# Patient Record
Sex: Female | Born: 1995 | Race: White | Hispanic: No | Marital: Married | Smoking: Current every day smoker
Health system: Southern US, Community
[De-identification: ages and names within clinical notes are randomized; demographics above are authoritative.]

## PROBLEM LIST (undated history)

## (undated) DIAGNOSIS — Z789 Other specified health status: Secondary | ICD-10-CM

## (undated) HISTORY — PX: NO PAST SURGERIES: SHX2092

---
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
## 2013-06-23 ENCOUNTER — Emergency Department: Payer: Self-pay | Admitting: Emergency Medicine

## 2016-04-08 ENCOUNTER — Other Ambulatory Visit: Payer: Self-pay | Admitting: Family Medicine

## 2016-04-08 DIAGNOSIS — F429 Obsessive-compulsive disorder, unspecified: Secondary | ICD-10-CM | POA: Diagnosis not present

## 2016-04-08 DIAGNOSIS — R1031 Right lower quadrant pain: Secondary | ICD-10-CM | POA: Diagnosis not present

## 2016-04-08 DIAGNOSIS — F419 Anxiety disorder, unspecified: Secondary | ICD-10-CM | POA: Diagnosis not present

## 2016-04-12 ENCOUNTER — Encounter
Admission: EM | Disposition: A | Discharge status: Home / Self Care | Payer: Self-pay | Source: Home / Self Care | Attending: Emergency Medicine

## 2016-04-12 ENCOUNTER — Emergency Department: Payer: BLUE CROSS/BLUE SHIELD | Admitting: Anesthesiology

## 2016-04-12 ENCOUNTER — Encounter: Payer: Self-pay | Admitting: Obstetrics and Gynecology

## 2016-04-12 ENCOUNTER — Emergency Department
Admission: EM | Admit: 2016-04-12 | Discharge: 2016-04-12 | Disposition: A | Discharge status: Home / Self Care | Payer: BLUE CROSS/BLUE SHIELD | Attending: Emergency Medicine | Admitting: Emergency Medicine

## 2016-04-12 ENCOUNTER — Emergency Department: Payer: BLUE CROSS/BLUE SHIELD

## 2016-04-12 DIAGNOSIS — F17200 Nicotine dependence, unspecified, uncomplicated: Secondary | ICD-10-CM | POA: Diagnosis not present

## 2016-04-12 DIAGNOSIS — R102 Pelvic and perineal pain: Secondary | ICD-10-CM | POA: Diagnosis not present

## 2016-04-12 DIAGNOSIS — N801 Endometriosis of ovary: Secondary | ICD-10-CM | POA: Insufficient documentation

## 2016-04-12 DIAGNOSIS — N8301 Follicular cyst of right ovary: Secondary | ICD-10-CM | POA: Diagnosis not present

## 2016-04-12 DIAGNOSIS — K660 Peritoneal adhesions (postprocedural) (postinfection): Secondary | ICD-10-CM | POA: Insufficient documentation

## 2016-04-12 DIAGNOSIS — Z79899 Other long term (current) drug therapy: Secondary | ICD-10-CM | POA: Diagnosis not present

## 2016-04-12 DIAGNOSIS — M791 Myalgia: Secondary | ICD-10-CM | POA: Diagnosis not present

## 2016-04-12 DIAGNOSIS — N83209 Unspecified ovarian cyst, unspecified side: Secondary | ICD-10-CM | POA: Diagnosis present

## 2016-04-12 DIAGNOSIS — N809 Endometriosis, unspecified: Secondary | ICD-10-CM | POA: Diagnosis not present

## 2016-04-12 DIAGNOSIS — N83201 Unspecified ovarian cyst, right side: Secondary | ICD-10-CM | POA: Diagnosis not present

## 2016-04-12 DIAGNOSIS — R109 Unspecified abdominal pain: Secondary | ICD-10-CM | POA: Diagnosis present

## 2016-04-12 DIAGNOSIS — R1084 Generalized abdominal pain: Secondary | ICD-10-CM | POA: Diagnosis not present

## 2016-04-12 HISTORY — PX: LAPAROSCOPIC OVARIAN CYSTECTOMY: SHX6248

## 2016-04-12 HISTORY — DX: Other specified health status: Z78.9

## 2016-04-12 LAB — URINALYSIS COMPLETE WITH MICROSCOPIC (ARMC ONLY)
BACTERIA UA: NONE SEEN
Bilirubin Urine: NEGATIVE
Glucose, UA: NEGATIVE mg/dL
KETONES UR: NEGATIVE mg/dL
LEUKOCYTES UA: NEGATIVE
NITRITE: NEGATIVE
PH: 6 (ref 5.0–8.0)
PROTEIN: NEGATIVE mg/dL
SPECIFIC GRAVITY, URINE: 1.008 (ref 1.005–1.030)

## 2016-04-12 LAB — WET PREP, GENITAL
CLUE CELLS WET PREP: NONE SEEN
SPERM: NONE SEEN
TRICH WET PREP: NONE SEEN
Yeast Wet Prep HPF POC: NONE SEEN

## 2016-04-12 LAB — POCT PREGNANCY, URINE: Preg Test, Ur: NEGATIVE

## 2016-04-12 LAB — COMPREHENSIVE METABOLIC PANEL
ALT: 16 U/L (ref 14–54)
AST: 20 U/L (ref 15–41)
Albumin: 4.7 g/dL (ref 3.5–5.0)
Alkaline Phosphatase: 51 U/L (ref 38–126)
Anion gap: 9 (ref 5–15)
BUN: 17 mg/dL (ref 6–20)
CHLORIDE: 101 mmol/L (ref 101–111)
CO2: 25 mmol/L (ref 22–32)
CREATININE: 0.8 mg/dL (ref 0.44–1.00)
Calcium: 9.3 mg/dL (ref 8.9–10.3)
GFR calc non Af Amer: >60 mL/min (ref >60)
Glucose, Bld: 166 mg/dL — ABNORMAL HIGH (ref 65–99)
Potassium: 3.1 mmol/L — ABNORMAL LOW (ref 3.5–5.1)
SODIUM: 135 mmol/L (ref 135–145)
Total Bilirubin: 0.7 mg/dL (ref 0.3–1.2)
Total Protein: 8.1 g/dL (ref 6.5–8.1)

## 2016-04-12 LAB — CBC
HCT: 41.2 % (ref 35.0–47.0)
Hemoglobin: 13.9 g/dL (ref 12.0–16.0)
MCH: 30.7 pg (ref 26.0–34.0)
MCHC: 33.8 g/dL (ref 32.0–36.0)
MCV: 91.1 fL (ref 80.0–100.0)
PLATELETS: 354 10*3/uL (ref 150–440)
RBC: 4.52 MIL/uL (ref 3.80–5.20)
RDW: 13.9 % (ref 11.5–14.5)
WBC: 16.2 10*3/uL — AB (ref 3.6–11.0)

## 2016-04-12 LAB — CHLAMYDIA/NGC RT PCR (ARMC ONLY)
Chlamydia Tr: NOT DETECTED
N gonorrhoeae: NOT DETECTED

## 2016-04-12 LAB — HCG, QUANTITATIVE, PREGNANCY: hCG, Beta Chain, Quant, S: 1 m[IU]/mL (ref <5)

## 2016-04-12 SURGERY — EXCISION, CYST, OVARY, LAPAROSCOPIC
Anesthesia: General | Site: Abdomen | Laterality: Right | Wound class: Clean Contaminated

## 2016-04-12 MED ORDER — MORPHINE SULFATE (PF) 4 MG/ML IV SOLN
4.0000 mg | Freq: Once | INTRAVENOUS | Status: AC
  Administered 2016-04-12: 4 mg via INTRAVENOUS
  Filled 2016-04-12: qty 1

## 2016-04-12 MED ORDER — FENTANYL CITRATE (PF) 100 MCG/2ML IJ SOLN: 25.0000 ug | INTRAMUSCULAR | Status: DC | PRN

## 2016-04-12 MED ORDER — LIDOCAINE HCL (CARDIAC) 20 MG/ML IV SOLN
INTRAVENOUS | Status: DC | PRN
  Administered 2016-04-12: 60 mg via INTRAVENOUS

## 2016-04-12 MED ORDER — MORPHINE SULFATE (PF) 4 MG/ML IV SOLN
INTRAVENOUS | Status: AC
  Filled 2016-04-12: qty 1

## 2016-04-12 MED ORDER — GLYCOPYRROLATE 0.2 MG/ML IJ SOLN
INTRAMUSCULAR | Status: DC | PRN
  Administered 2016-04-12: .6 mg via INTRAVENOUS

## 2016-04-12 MED ORDER — FENTANYL CITRATE (PF) 100 MCG/2ML IJ SOLN
INTRAMUSCULAR | Status: DC | PRN
  Administered 2016-04-12: 100 ug via INTRAVENOUS
  Administered 2016-04-12 (×2): 50 ug via INTRAVENOUS
  Administered 2016-04-12: 100 ug via INTRAVENOUS

## 2016-04-12 MED ORDER — LACTATED RINGERS IV SOLN
INTRAVENOUS | Status: DC | PRN
  Administered 2016-04-12 (×2): via INTRAVENOUS

## 2016-04-12 MED ORDER — ONDANSETRON HCL 4 MG/2ML IJ SOLN: 4.0000 mg | Freq: Once | INTRAMUSCULAR | Status: DC | PRN

## 2016-04-12 MED ORDER — MIDAZOLAM HCL 2 MG/2ML IJ SOLN
INTRAMUSCULAR | Status: DC | PRN
  Administered 2016-04-12: 2 mg via INTRAVENOUS

## 2016-04-12 MED ORDER — HYDROCODONE-ACETAMINOPHEN 5-325 MG PO TABS: 2.0000 | ORAL_TABLET | Freq: Four times a day (QID) | ORAL | Status: DC | PRN

## 2016-04-12 MED ORDER — BUPIVACAINE HCL 0.5 % IJ SOLN
INTRAMUSCULAR | Status: DC | PRN
  Administered 2016-04-12: 4 mL

## 2016-04-12 MED ORDER — NEOSTIGMINE METHYLSULFATE 10 MG/10ML IV SOLN
INTRAVENOUS | Status: DC | PRN
  Administered 2016-04-12: 4 mg via INTRAVENOUS

## 2016-04-12 MED ORDER — ONDANSETRON HCL 4 MG/2ML IJ SOLN
4.0000 mg | Freq: Once | INTRAMUSCULAR | Status: AC
  Administered 2016-04-12: 4 mg via INTRAVENOUS
  Filled 2016-04-12: qty 2

## 2016-04-12 MED ORDER — SODIUM CHLORIDE 0.9 % IV BOLUS (SEPSIS)
1000.0000 mL | Freq: Once | INTRAVENOUS | Status: AC
  Administered 2016-04-12: 1000 mL via INTRAVENOUS

## 2016-04-12 MED ORDER — SUCCINYLCHOLINE CHLORIDE 20 MG/ML IJ SOLN
INTRAMUSCULAR | Status: DC | PRN
  Administered 2016-04-12: 100 mg via INTRAVENOUS

## 2016-04-12 MED ORDER — ROCURONIUM BROMIDE 100 MG/10ML IV SOLN
INTRAVENOUS | Status: DC | PRN
  Administered 2016-04-12: 10 mg via INTRAVENOUS
  Administered 2016-04-12: 5 mg via INTRAVENOUS
  Administered 2016-04-12: 10 mg via INTRAVENOUS
  Administered 2016-04-12: 25 mg via INTRAVENOUS
  Administered 2016-04-12: 20 mg via INTRAVENOUS

## 2016-04-12 MED ORDER — DEXAMETHASONE SODIUM PHOSPHATE 10 MG/ML IJ SOLN
INTRAMUSCULAR | Status: DC | PRN
  Administered 2016-04-12: 10 mg via INTRAVENOUS

## 2016-04-12 MED ORDER — IBUPROFEN 600 MG PO TABS: 600.0000 mg | ORAL_TABLET | Freq: Four times a day (QID) | ORAL | Status: DC | PRN

## 2016-04-12 MED ORDER — MORPHINE SULFATE (PF) 4 MG/ML IV SOLN
4.0000 mg | Freq: Once | INTRAVENOUS | Status: AC
  Administered 2016-04-12: 4 mg via INTRAVENOUS

## 2016-04-12 MED ORDER — ACETAMINOPHEN 10 MG/ML IV SOLN
INTRAVENOUS | Status: DC | PRN
  Administered 2016-04-12: 1000 mg via INTRAVENOUS

## 2016-04-12 MED ORDER — KETOROLAC TROMETHAMINE 30 MG/ML IJ SOLN
INTRAMUSCULAR | Status: DC | PRN
  Administered 2016-04-12: 30 mg via INTRAVENOUS

## 2016-04-12 MED ORDER — PROPOFOL 10 MG/ML IV BOLUS
INTRAVENOUS | Status: DC | PRN
  Administered 2016-04-12: 170 mg via INTRAVENOUS

## 2016-04-12 MED ORDER — LORAZEPAM 2 MG/ML IJ SOLN
1.0000 mg | Freq: Once | INTRAMUSCULAR | Status: AC
  Administered 2016-04-12: 1 mg via INTRAVENOUS
  Filled 2016-04-12: qty 1

## 2016-04-12 MED ORDER — MORPHINE SULFATE (PF) 4 MG/ML IV SOLN
4.0000 mg | Freq: Once | INTRAVENOUS | Status: AC
Start: 2016-04-12 — End: 2016-04-12
  Administered 2016-04-12: 4 mg via INTRAVENOUS
  Filled 2016-04-12: qty 1

## 2016-04-12 SURGICAL SUPPLY — 47 items
BLADE SURG SZ11 CARB STEEL (BLADE) ×2 IMPLANT
CANISTER SUCT 1200ML W/VALVE (MISCELLANEOUS) ×2 IMPLANT
CATH FOL 2WAY LX 16X5 (CATHETERS) ×2 IMPLANT
CHLORAPREP W/TINT 26ML (MISCELLANEOUS) ×2 IMPLANT
DRAPE LEGGINS SURG 28X43 STRL (DRAPES) ×2 IMPLANT
DRAPE SHEET LG 3/4 BI-LAMINATE (DRAPES) ×4 IMPLANT
DRAPE UNDER BUTTOCK W/FLU (DRAPES) ×2 IMPLANT
DRESSING SURGICEL FIBRLLR 1X2 (HEMOSTASIS) ×1 IMPLANT
DRSG SURGICEL FIBRILLAR 1X2 (HEMOSTASIS) ×2
ELECT REM PT RETURN 9FT ADLT (ELECTROSURGICAL) ×2
ELECTRODE REM PT RTRN 9FT ADLT (ELECTROSURGICAL) ×1 IMPLANT
GLOVE BIO SURGEON STRL SZ7 (GLOVE) ×4 IMPLANT
GLOVE BIOGEL PI IND STRL 7.5 (GLOVE) ×1 IMPLANT
GLOVE BIOGEL PI INDICATOR 7.5 (GLOVE) ×1
GOWN STRL REUS W/ TWL LRG LVL3 (GOWN DISPOSABLE) ×2 IMPLANT
GOWN STRL REUS W/TWL LRG LVL3 (GOWN DISPOSABLE) ×2
GRASPER SUT TROCAR 14GX15 (MISCELLANEOUS) ×2 IMPLANT
IRRIGATION STRYKERFLOW (MISCELLANEOUS) ×1 IMPLANT
IRRIGATOR STRYKERFLOW (MISCELLANEOUS) ×2
IV LACTATED RINGERS 1000ML (IV SOLUTION) ×2 IMPLANT
KIT RM TURNOVER CYSTO AR (KITS) ×2 IMPLANT
LABEL OR SOLS (LABEL) ×2 IMPLANT
LIGASURE BLUNT 5MM 37CM (INSTRUMENTS) ×2 IMPLANT
LIQUID BAND (GAUZE/BANDAGES/DRESSINGS) ×2 IMPLANT
NDL SAFETY 22GX1.5 (NEEDLE) ×2 IMPLANT
NS IRRIG 500ML POUR BTL (IV SOLUTION) ×2 IMPLANT
PACK LAP CHOLECYSTECTOMY (MISCELLANEOUS) ×2 IMPLANT
PAD OB MATERNITY 4.3X12.25 (PERSONAL CARE ITEMS) ×2 IMPLANT
PAD PREP 24X41 OB/GYN DISP (PERSONAL CARE ITEMS) ×2 IMPLANT
POUCH ENDO CATCH 10MM SPEC (MISCELLANEOUS) ×2 IMPLANT
SCISSORS METZENBAUM CVD 33 (INSTRUMENTS) ×2 IMPLANT
SHEARS HARMONIC ACE PLUS 36CM (ENDOMECHANICALS) IMPLANT
SLEEVE ENDOPATH XCEL 5M (ENDOMECHANICALS) ×4 IMPLANT
SOL PREP PVP 2OZ (MISCELLANEOUS) ×2
SOLUTION PREP PVP 2OZ (MISCELLANEOUS) ×1 IMPLANT
SURGILUBE 2OZ TUBE FLIPTOP (MISCELLANEOUS) ×2 IMPLANT
SUT MNCRL 4-0 (SUTURE)
SUT MNCRL 4-0 27XMFL (SUTURE)
SUT VIC AB 0 CT1 36 (SUTURE) ×2 IMPLANT
SUT VIC AB 2-0 UR6 27 (SUTURE) IMPLANT
SUT VIC AB 4-0 SH 27 (SUTURE) ×1
SUT VIC AB 4-0 SH 27XANBCTRL (SUTURE) ×1 IMPLANT
SUTURE MNCRL 4-0 27XMF (SUTURE) IMPLANT
TROCAR ENDO BLADELESS 11MM (ENDOMECHANICALS) ×2 IMPLANT
TROCAR XCEL NON-BLD 5MMX100MML (ENDOMECHANICALS) ×2 IMPLANT
TROCAR XCEL UNIV SLVE 11M 100M (ENDOMECHANICALS) IMPLANT
TUBING INSUFFLATOR HI FLOW (MISCELLANEOUS) ×2 IMPLANT

## 2016-04-12 NOTE — ED Provider Notes (Signed)
Dr. Jean RosenthalJackson plans to take patient to the OR for surgical management of symptoms. Remains hemodynamically stable at this time.  Sharman CheekPhillip Daysy Santini, MD 04/12/16 [phone]

## 2016-04-12 NOTE — ED Notes (Signed)
Pt states that she has been having some abd pain for the past few weeks and her dr is ordering her a pelvic and abd u/s. Pt states that she woke up this am with severe abd pain, states worst pain ever, pt is moaning and crying in pain, states tender all over and does state pain with urination

## 2016-04-12 NOTE — Anesthesia Procedure Notes (Signed)
Procedure Name: Intubation Date/Time: 04/12/2016 5:39 PM Performed by: Irving BurtonBACHICH, Haddy Mullinax Pre-anesthesia Checklist: Patient identified, Emergency Drugs available, Suction available and Patient being monitored Patient Re-evaluated:Patient Re-evaluated prior to inductionOxygen Delivery Method: Circle system utilized Preoxygenation: Pre-oxygenation with 100% oxygen Intubation Type: IV induction Ventilation: Mask ventilation without difficulty Laryngoscope Size: Mac and 3 Grade View: Grade I Tube type: Oral Tube size: 7.0 mm Number of attempts: 1 Airway Equipment and Method: Stylet Placement Confirmation: ETT inserted through vocal cords under direct vision,  positive ETCO2 and breath sounds checked- equal and bilateral Secured at: 20 cm Tube secured with: Tape Dental Injury: Teeth and Oropharynx as per pre-operative assessment

## 2016-04-12 NOTE — Op Note (Signed)
  Operative Note    Pre-Op Diagnosis:  1) right ovarian cyst 2) abdominal pain, suprapubic  Post-Op Diagnosis:  1) right ovarian cyst 2) abdominal pain, suprapubic  Procedures:  1. Operative laparoscopy 2. Right ovarian cystectomy  Primary Surgeon: Thomasene MohairStephen Luisana Lutzke, MD   EBL: 300 mL   IVF: 1,500 mL   Urine output: 500 mL  Specimens: right ovarian cyst wall  Drains: none  Complications: None   Disposition: PACU   Condition: Stable   Findings:  1) Enlarged right ovary with adhesions of omentum, bowel, uterus, pelvic sidewall 2) Left ovary appears normal 3) adhesions to left side wall of ovary and fallopian tube 4) about 1 liter of dark brown fluid drained from cyst prior to removal  Procedure Summary:  The patient was taken to the operating room where general anesthesia was administered and found to be adequate. She was placed in the dorsal supine lithotomy position in Labette[place] stirrups and prepped and draped in usual sterile fashion. After a timeout was called an indwelling catheter was placed in her bladder.   Attention was turned to the abdomen where after injection of local anesthetic, a 5 mm infraumbilical incision was made with the scalpel. Entry into the abdomen was obtained via Optiview trocar technique (a blunt entry technique with camera visualization through the obturator upon entry). Verification of entry into the abdomen was obtained using opening pressures. The abdomen was insufflated with CO2. The camera was introduced through the trocar with verification of atraumatic entry.  A survey was taken of the abdomen and pelvis with the above noted findings.    A 5mm left lower quadrant port was placed under direct, intraabdominal camera visualization without difficulty.  An 4266m suprapubic port was placed about 3cm above the pubic bone without difficulty under direct, intraabdominal camera visualization without difficulty.  A large-bore needle was used to puncture the  cyst. The needle was attached to suction, which evacuated the contents of the cyst.  Approximately 1 liter of dark, brown fluid was evacuated.  The ovarian capsule was opened and the cyst wall was gently peeled off the ovarian stroma.  The cyst wall was transected and removed in several small pieces through the 66mm port.  Once the entire cyst wall was removed, irrigation was used to assess bleeding.  Hemostasis was obtained using Fibrillar directly on the cyst wall bed.  Copious irrigation was undertaken to attempt to clean out of the pelvis.  The 66mm trocar was removed and the port site was closed at the fascia level with 0 vicryl using a Carter-Thomason device.  CO2 was drained from the abdominal cavity and anesthesia gave the patient five deep breaths to remove as much CO2 as possible.  The remaining trocars were removed and reinforced with 4-0 vicryl.  The skin was closed using surgical skin glue.  The foley catheter was removed from the bladder and a vaginal sweep revealed no instruments or sponges.  The patient tolerated the procedure well.  Sponge, lap, needle, and instrument counts were correct x 2.  VTE prophylaxis: SCDs. Antibiotic prophylaxis: none indicated. She was awakened in the operating room and was taken to the PACU in stable condition.   Thomasene MohairStephen Iyari Hagner, MD 04/12/2016 8:24 PM

## 2016-04-12 NOTE — Transfer of Care (Signed)
Immediate Anesthesia Transfer of Care Note  Patient: Jessica Goodman  Procedure(s) Performed: Procedure(s): LAPAROSCOPIC OVARIAN CYSTECTOMY (Right)  Patient Location: PACU  Anesthesia Type:General  Level of Consciousness: sedated  Airway & Oxygen Therapy: Patient Spontanous Breathing and Patient connected to face mask oxygen  Post-op Assessment: Report given to RN and Post -op Vital signs reviewed and stable  Post vital signs: Reviewed and stable  Last Vitals:  Filed Vitals:   04/12/16 2029 04/12/16 2030  BP: 102/46   Pulse: 72 72  Temp: 36.2 C   Resp: 13 11    Last Pain:  Filed Vitals:   04/12/16 2032  PainSc: 5          Complications: No apparent anesthesia complications

## 2016-04-12 NOTE — Anesthesia Postprocedure Evaluation (Signed)
Anesthesia Post Note  Patient: Jessica Goodman  Procedure(s) Performed: Procedure(s) (LRB): LAPAROSCOPIC OVARIAN CYSTECTOMY (Right)  Patient location during evaluation: PACU Anesthesia Type: General Level of consciousness: awake Pain management: pain level controlled Vital Signs Assessment: post-procedure vital signs reviewed and stable Respiratory status: spontaneous breathing Cardiovascular status: stable Anesthetic complications: no    Last Vitals:  Filed Vitals:   04/12/16 2110 04/12/16 2115  BP: 127/74   Pulse: 86 81  Temp:  37.1 C  Resp: 15 17    Last Pain:  Filed Vitals:   04/12/16 2119  PainSc: 5                  VAN STAVEREN,Jaivion Kingsley

## 2016-04-12 NOTE — Consult Note (Signed)
GYNECOLOGY CONSULT NOTE  GYN Consultation  Attending Provider: Minna Antis, MD  LIBORIA PUTNAM [id number] 04/12/2016 3:59 PM    Reason for Consultation:   Jacquiline Doe is a 20 y.o. G0P0000 female seen for evaluation of acute onset abdominal pain and right ovarian cyst.    History of Present Ilness:   She had acute-onset suprapubic abdominal pain at about 7 am today.  This woke her from sleep. She fell on the floor in pain and had nausea and emesis.  She has about a six month history of right lower quadrant pain which has been investigated without an obvious source. She was scheduled to get a pelvic ultrasound in the near future.  Her pain feels like a stabbing.  It radiates around to her back.  It is 10 out of 10 at it's worst. Nothing makes it better or worse.  She associates no other symptoms apart from the above.  The pain has required four doses of morphine 4mg  IV here in the ER.  Menarche at the age of 20 with monthly cycles, every four weeks, lasting 3-4 days. They are moderate in flow.  She is on nothing for contraception.     Past Medical History  Diagnosis Date  . Medical history non-contributory    Past Surgical History  Procedure Laterality Date  . No past surgeries     Allergies:  No Known Allergies   Prior to Admission medications   Medication Sig Start Date End Date Taking? Authorizing Provider  sertraline (ZOLOFT) 50 MG tablet Take 50 mg by mouth daily.   Yes Historical Provider, MD   Obstetric History: She is a G0P0000 female .    Gynecologic History:   Menarche age: 89 Patient's last menstrual period was 04/05/2016. Cycle Hx: flow is moderate Contraception: none   Social History:  She denies smoking, tobacco use, and alcohol use.   Family History:  family history includes Cancer in her maternal grandfather.   Review of Systems:  Negative x 10 systems reviewed except as noted in the HPI.    Objective    BP 119/64 mmHg  Pulse 92  Temp(Src) 97.7 F  (36.5 C) (Oral)  Resp 18  Ht 5\' 5"  (1.651 m)  Wt 140 lb (63.504 kg)  BMI 23.30 kg/m2  SpO2 96%  LMP 04/05/2016 Physical Exam  General:  She is a healthy appearing female in moderate distress.  HEENT:  Normocephalic, atraumatic.   Neck:  supple, no lymphadenopathy Cardiac:  RRR Pulmonary:  Clear to auscultation bilaterally. No wheezes, rales, rhonchi.   Abdomen:  Moderate firm, diffusely tender to palpation, but worse below the umbilicus. +BS, no guarding.  Difficult to tell about rebound tenderness as she is quite tender.  Pelvic:  Deferred.  Performed by ER physician with no significant findings.  Extremities:  Non-tender, symmetric no edema bilaterally.   Neurologic:  Alert & oriented x 3.  Appropriate, conversant.    Laboratory Results:   Lab Results  Component Value Date   WBC 16.2* 04/12/2016   RBC 4.52 04/12/2016   HGB 13.9 04/12/2016   HCT 41.2 04/12/2016   PLT 354 04/12/2016   NA 135 04/12/2016   K 3.1* 04/12/2016   CREATININE 0.80 04/12/2016   Lab Results  Component Value Date   PREGTESTUR NEGATIVE 04/12/2016   Lab Results  Component Value Date   CHLAMYDIA NOT DETECTED 04/12/2016   NGONORRHOEAE NOT DETECTED 04/12/2016   Lab Results  Component Value Date   APPEARANCEUR CLEAR* 04/12/2016  GLUCOSEU NEGATIVE 04/12/2016   BILIRUBINUR NEGATIVE 04/12/2016   KETONESUR NEGATIVE 04/12/2016   LABSPEC 1.008 04/12/2016   HGBUR 1+* 04/12/2016   PHURINE 6.0 04/12/2016   NITRITE NEGATIVE 04/12/2016   LEUKOCYTESUR NEGATIVE 04/12/2016   RBCU 0-5 04/12/2016   WBCU 0-5 04/12/2016   BACTERIA NONE SEEN 04/12/2016   EPIU 0-5* 04/12/2016   MUCOUSUACOMP PRESENT 04/12/2016    Imaging Results: US Transvaginal Non-ob  04/12/2016  CLINICAL DATA:  Myalgias with onset 6 days ago. EXAM: TRANSABDOMINAL AND TRANSVAGINAL ULTRASOUND OF PELVIS DOPPLER ULTRASOUND OF OVARIES TECHNIQUE: Both transabdominal and transvaginal ultrasound examinations of the pelvis were performed.  Transabdominal technique was performed for global imaging of the pelvis including uterus, ovaries, adnexal regions, and pelvic cul-de-sac. It was necessary to proceed with endovaginal exam following the transabdominal exam to visualize the ovaries and endometrium. Color and duplex Doppler ultrasound was utilized to evaluate blood flow to the ovaries. COMPARISON:  None. FINDINGS: Uterus Measurements: 8.1 x 3.3 x 3.7 cm. No fibroids or other mass visualized. Endometrium Thickness: 9.6 mm.  No focal abnormality visualized. Right ovary Measurements: 12.6 x 7.7 x 8.8 cm. Large 12.3 x 9 x 7.4 cm heterogeneously hypoechoic avascular right ovarian mass which may reflect a large endometrioma versus hemorrhagic cyst. Left ovary Measurements: 2.8 x 2.2 x 1.9 cm. Normal appearance/no adnexal mass. Pulsed Doppler evaluation of both ovaries demonstrates normal low-resistance arterial and venous waveforms. Other findings Moderate amount of pelvic free fluid. IMPRESSION: 1. Large 12.3 x 9 x 7.4 cm heterogeneously hypoechoic avascular right ovarian mass which may reflect a large endometrioma versus hemorrhagic cyst. Follow-up pelvic ultrasound in 6-8 weeks is recommended. 2. No ovarian torsion. Electronically Signed   By: Elige Ko   On: 04/12/2016 12:01   US Pelvis Complete  04/12/2016  CLINICAL DATA:  Myalgias with onset 6 days ago. EXAM: TRANSABDOMINAL AND TRANSVAGINAL ULTRASOUND OF PELVIS DOPPLER ULTRASOUND OF OVARIES TECHNIQUE: Both transabdominal and transvaginal ultrasound examinations of the pelvis were performed. Transabdominal technique was performed for global imaging of the pelvis including uterus, ovaries, adnexal regions, and pelvic cul-de-sac. It was necessary to proceed with endovaginal exam following the transabdominal exam to visualize the ovaries and endometrium. Color and duplex Doppler ultrasound was utilized to evaluate blood flow to the ovaries. COMPARISON:  None. FINDINGS: Uterus Measurements: 8.1 x 3.3  x 3.7 cm. No fibroids or other mass visualized. Endometrium Thickness: 9.6 mm.  No focal abnormality visualized. Right ovary Measurements: 12.6 x 7.7 x 8.8 cm. Large 12.3 x 9 x 7.4 cm heterogeneously hypoechoic avascular right ovarian mass which may reflect a large endometrioma versus hemorrhagic cyst. Left ovary Measurements: 2.8 x 2.2 x 1.9 cm. Normal appearance/no adnexal mass. Pulsed Doppler evaluation of both ovaries demonstrates normal low-resistance arterial and venous waveforms. Other findings Moderate amount of pelvic free fluid. IMPRESSION: 1. Large 12.3 x 9 x 7.4 cm heterogeneously hypoechoic avascular right ovarian mass which may reflect a large endometrioma versus hemorrhagic cyst. Follow-up pelvic ultrasound in 6-8 weeks is recommended. 2. No ovarian torsion. Electronically Signed   By: Elige Ko   On: 04/12/2016 12:01   [place] Art/ven Flow Abd Pelv Doppler  04/12/2016  CLINICAL DATA:  Myalgias with onset 6 days ago. EXAM: TRANSABDOMINAL AND TRANSVAGINAL ULTRASOUND OF PELVIS DOPPLER ULTRASOUND OF OVARIES TECHNIQUE: Both transabdominal and transvaginal ultrasound examinations of the pelvis were performed. Transabdominal technique was performed for global imaging of the pelvis including uterus, ovaries, adnexal regions, and pelvic cul-de-sac. It was necessary to proceed with endovaginal exam  following the transabdominal exam to visualize the ovaries and endometrium. Color and duplex Doppler ultrasound was utilized to evaluate blood flow to the ovaries. COMPARISON:  None. FINDINGS: Uterus Measurements: 8.1 x 3.3 x 3.7 cm. No fibroids or other mass visualized. Endometrium Thickness: 9.6 mm.  No focal abnormality visualized. Right ovary Measurements: 12.6 x 7.7 x 8.8 cm. Large 12.3 x 9 x 7.4 cm heterogeneously hypoechoic avascular right ovarian mass which may reflect a large endometrioma versus hemorrhagic cyst. Left ovary Measurements: 2.8 x 2.2 x 1.9 cm. Normal appearance/no adnexal mass. Pulsed Doppler  evaluation of both ovaries demonstrates normal low-resistance arterial and venous waveforms. Other findings Moderate amount of pelvic free fluid. IMPRESSION: 1. Large 12.3 x 9 x 7.4 cm heterogeneously hypoechoic avascular right ovarian mass which may reflect a large endometrioma versus hemorrhagic cyst. Follow-up pelvic ultrasound in 6-8 weeks is recommended. 2. No ovarian torsion. Electronically Signed   By: Elige KoHetal  Patel   On: 04/12/2016 12:01     Assessment & Recommendations   Tamaiya Hilarie FredricksonG Wilkinson is a 20 y.o. G0P0000 female with acute onset abdominal pain with a 12cm right ovarian cyst either a hemorrhagic cyst versus endometrioma.  Discussed treatment options, including; clinical observation (do nothing at this time), treatment with pain medications and outpatient follow up, and surgery for definitive management.  I can not definitively rule out ovarian torsion.  After discussion a mutual decision is made to proceed with an operative laparoscopy, right ovarian cystectomy, possible right oophorectomy.  I have reviewed the risks, benefit, and alternatives of this surgery.  She agrees to proceed.  Will post for right now.   Conard NovakJackson, Dayshawn Irizarry D, MD 04/12/2016 3:59 PM

## 2016-04-12 NOTE — Anesthesia Preprocedure Evaluation (Signed)
Anesthesia Evaluation  Patient identified by MRN, date of birth, ID band Patient awake    Reviewed: Allergy & Precautions, NPO status , Patient's Chart, lab work & pertinent test results  Airway Mallampati: I       Dental  (+) Teeth Intact   Pulmonary Current Smoker,    Pulmonary exam normal        Cardiovascular Exercise Tolerance: Good  Rhythm:Regular Rate:Normal     Neuro/Psych negative neurological ROS     GI/Hepatic negative GI ROS, Neg liver ROS,   Endo/Other  negative endocrine ROS  Renal/GU negative Renal ROS  negative genitourinary   Musculoskeletal negative musculoskeletal ROS (+)   Abdominal Normal abdominal exam  (+)   Peds negative pediatric ROS (+)  Hematology negative hematology ROS (+)   Anesthesia Other Findings   Reproductive/Obstetrics negative OB ROS                             Anesthesia Physical Anesthesia Plan  ASA: II  Anesthesia Plan: General   Post-op Pain Management:    Induction: Intravenous  Airway Management Planned: Oral ETT  Additional Equipment:   Intra-op Plan:   Post-operative Plan: Extubation in OR  Informed Consent: I have reviewed the patients History and Physical, chart, labs and discussed the procedure including the risks, benefits and alternatives for the proposed anesthesia with the patient or authorized representative who has indicated his/her understanding and acceptance.     Plan Discussed with: CRNA  Anesthesia Plan Comments:         Anesthesia Quick Evaluation

## 2016-04-12 NOTE — ED Notes (Signed)
MD at bedside, discussing surgery with pt.

## 2016-04-12 NOTE — ED Provider Notes (Signed)
[facility] Emergency Department Provider Note  Time seen: 8:09 AM  I have reviewed the triage vital signs and the nursing notes.   HISTORY  Chief Complaint No chief complaint on file.    HPI Tanisia Hilarie FredricksonG Goodman is a 20 y.o. female with no past medical history who presents the emergency department with lower abdominal pain. According to the patient she awoke early this morning with severe lower abdominal pain. States nausea, denies vomiting or diarrhea. States her entire abdomen is hurting but the lower abdomen is worse, cannot specify right or left-sided. States she has been having "stomach issues" in which she has been having intermittent pain on the right side of her abdomen, and has an ultrasound scheduled. Patient states mild dysuria, denies vaginal bleeding but does state mild discharge. Denies fever. Describes her pain as 10/10 sharp lower abdominal pain.     No past medical history on file.  There are no active problems to display for this patient.   No past surgical history on file.  No current outpatient prescriptions on file.  Allergies Review of patient's allergies indicates not on file.  No family history on file.  Social History Social History  Substance Use Topics  . Smoking status: Not on file  . Smokeless tobacco: Not on file  . Alcohol Use: Not on file    Review of Systems Constitutional: Negative for fever. Cardiovascular: Negative for chest pain. Respiratory: Negative for shortness of breath. Gastrointestinal: Lower abdominal pain. Genitourinary: Negative for dysuria. Neurological: Negative for headache 10-point ROS otherwise negative.  ____________________________________________   PHYSICAL EXAM:  Constitutional: Alert and oriented. Mild distress due to pain and nausea Eyes: Normal exam ENT   Head: Normocephalic and atraumatic.   Mouth/Throat: Mucous membranes are moist. Cardiovascular: Normal rate, regular  rhythm. No murmur Respiratory: Normal respiratory effort without tachypnea nor retractions. Breath sounds are clear  Gastrointestinal: Soft, mild diffuse abdominal tenderness palpation, moderate lower abdominal tenderness with moderate right mid abdominal tenderness. No rebound or guarding. No distention. No CVA tenderness. Musculoskeletal: Nontender with normal range of motion in all extremities.  Neurologic:  Normal speech and language. No gross focal neurologic deficits Skin:  Skin is warm, dry and intact.  Psychiatric: Mood and affect are normal.   ____________________________________________    RADIOLOGY  Ultrasound consistent with a right ovarian large hemorrhagic cyst versus endometrioma.   ____________________________________________    INITIAL IMPRESSION / ASSESSMENT AND PLAN / ED COURSE  Pertinent labs & imaging results that were available during my care of the patient were reviewed by me and considered in my medical decision making (see chart for details).  Patient presents the emergency department with significant lower abdominal pain which began this morning. We will check labs, treat the patient's pain and nausea, IV hydrate and closely monitor in the emergency department. Patient will likely require abdominal imaging, we will await lab work before deciding on imaging modality.  Labs show an elevated white blood cell count of 16,000, otherwise are normal.  Pelvic examination shows a mild amount of white cervical discharge. No cervicitis noted. Patient did have significant tenderness to palpation of bilateral adnexa as well as cervical motion tenderness. We'll obtain a pelvic ultrasound to further evaluate.  Ultrasound shows large endometrioma versus hemorrhagic cyst, we'll discuss with Dr. Jean RosenthalJackson of OB/GYN.  ----------------------------------------- 3:13 PM on 04/12/2016 -----------------------------------------  Dr. Jean RosenthalJackson will be down to see the patient. Patient  care signed out to oncoming physician, OB/GYN consultation pending.   FINAL CLINICAL  IMPRESSION(S) / ED DIAGNOSES  Abdominal pain Endometrioma/hemorrhagic cyst   Minna Antis, MD 04/12/16 [phone]

## 2016-04-12 NOTE — Discharge Instructions (Signed)

## 2016-04-12 NOTE — ED Notes (Signed)
Pt taken to us.

## 2016-04-12 NOTE — H&P (Signed)
GYNECOLOGY HISTORY AND PHYSICAL NOTE    Attending Provider: Thomasene MohairStephen Jackson, MD  Jessica Goodman [phone] 04/12/2016 3:59 PM   Chief Complaint:  Jessica DoeMia G Goodman is a 20 y.o. G0P0000 female seen for evaluation of acute onset abdominal pain and right ovarian cyst.   History of Present Ilness:  She had acute-onset suprapubic abdominal pain at about 7 am today. This woke her from sleep. She fell on the floor in pain and had nausea and emesis. She has about a six month history of right lower quadrant pain which has been investigated without an obvious source. She was scheduled to get a pelvic ultrasound in the near future. Her pain feels like a stabbing. It radiates around to her back. It is 10 out of 10 at it's worst. Nothing makes it better or worse. She associates no other symptoms apart from the above. The pain has required four doses of morphine 4mg  IV here in the ER. Menarche at the age of 89 with monthly cycles, every four weeks, lasting 3-4 days. They are moderate in flow. She is on nothing for contraception.    Past Medical History  Diagnosis Date  . Medical history non-contributory    Past Surgical History  Procedure Laterality Date  . No past surgeries     Allergies:  No Known Allergies   Prior to Admission medications   Medication Sig Start Date End Date Taking? Authorizing Provider  sertraline (ZOLOFT) 50 MG tablet Take 50 mg by mouth daily.   Yes Historical Provider, MD   Obstetric History: She is a G0P0000 female .   Gynecologic History:  Menarche age: [age] Patient's last menstrual period was 04/05/2016. Cycle Hx: flow is moderate Contraception: none   Social History: She denies smoking, tobacco use, and alcohol use.   Family History: family history includes Cancer in her maternal grandfather.   Review of Systems: Negative x 10 systems reviewed except as noted in the HPI.   Objective    BP 119/64 mmHg   Pulse 92  Temp(Src) 97.7 F (36.5 C) (Oral)  Resp 18  Ht 5\' 5"  (1.651 m)  Wt 140 lb (63.504 kg)  BMI 23.30 kg/m2  SpO2 96%  LMP 04/05/2016 Physical Exam  General: She is a healthy appearing female in moderate distress.  HEENT: Normocephalic, atraumatic.  Neck: supple, no lymphadenopathy Cardiac: RRR Pulmonary: Clear to auscultation bilaterally. No wheezes, rales, rhonchi.  Abdomen: Moderate firm, diffusely tender to palpation, but worse below the umbilicus. +BS, no guarding. Difficult to tell about rebound tenderness as she is quite tender.  Pelvic: Deferred. Performed by ER physician with no significant findings.  Extremities: Non-tender, symmetric no edema bilaterally.  Neurologic: Alert & oriented x 3. Appropriate, conversant.   Laboratory Results:   Recent Labs    Lab Results  Component Value Date   WBC 16.2* 04/12/2016   RBC 4.52 04/12/2016   HGB 13.9 04/12/2016   HCT 41.2 04/12/2016   PLT 354 04/12/2016   NA 135 04/12/2016   K 3.1* 04/12/2016   CREATININE 0.80 04/12/2016      Recent Labs    Lab Results  Component Value Date   PREGTESTUR NEGATIVE 04/12/2016      Recent Labs    Lab Results  Component Value Date   CHLAMYDIA NOT DETECTED 04/12/2016   NGONORRHOEAE NOT DETECTED 04/12/2016      Recent Labs    Lab Results  Component Value Date   APPEARANCEUR CLEAR* 04/12/2016   GLUCOSEU NEGATIVE 04/12/2016  BILIRUBINUR NEGATIVE 04/12/2016   KETONESUR NEGATIVE 04/12/2016   LABSPEC 1.008 04/12/2016   HGBUR 1+* 04/12/2016   PHURINE 6.0 04/12/2016   NITRITE NEGATIVE 04/12/2016   LEUKOCYTESUR NEGATIVE 04/12/2016   RBCU 0-5 04/12/2016   WBCU 0-5 04/12/2016   BACTERIA NONE SEEN 04/12/2016   EPIU 0-5* 04/12/2016   MUCOUSUACOMP PRESENT 04/12/2016      Imaging Results: US Transvaginal Non-ob  04/12/2016 CLINICAL DATA:  Myalgias with onset 6 days ago. EXAM: TRANSABDOMINAL AND TRANSVAGINAL ULTRASOUND OF PELVIS DOPPLER ULTRASOUND OF OVARIES TECHNIQUE: Both transabdominal and transvaginal ultrasound examinations of the pelvis were performed. Transabdominal technique was performed for global imaging of the pelvis including uterus, ovaries, adnexal regions, and pelvic cul-de-sac. It was necessary to proceed with endovaginal exam following the transabdominal exam to visualize the ovaries and endometrium. Color and duplex Doppler ultrasound was utilized to evaluate blood flow to the ovaries. COMPARISON: None. FINDINGS: Uterus Measurements: 8.1 x 3.3 x 3.7 cm. No fibroids or other mass visualized. Endometrium Thickness: 9.6 mm. No focal abnormality visualized. Right ovary Measurements: 12.6 x 7.7 x 8.8 cm. Large 12.3 x 9 x 7.4 cm heterogeneously hypoechoic avascular right ovarian mass which may reflect a large endometrioma versus hemorrhagic cyst. Left ovary Measurements: 2.8 x 2.2 x 1.9 cm. Normal appearance/no adnexal mass. Pulsed Doppler evaluation of both ovaries demonstrates normal low-resistance arterial and venous waveforms. Other findings Moderate amount of pelvic free fluid. IMPRESSION: 1. Large 12.3 x 9 x 7.4 cm heterogeneously hypoechoic avascular right ovarian mass which may reflect a large endometrioma versus hemorrhagic cyst. Follow-up pelvic ultrasound in 6-8 weeks is recommended. 2. No ovarian torsion. Electronically Signed By: Elige Ko On: 04/12/2016 12:01   US Pelvis Complete  04/12/2016 CLINICAL DATA: Myalgias with onset 6 days ago. EXAM: TRANSABDOMINAL AND TRANSVAGINAL ULTRASOUND OF PELVIS DOPPLER ULTRASOUND OF OVARIES TECHNIQUE: Both transabdominal and transvaginal ultrasound examinations of the pelvis were performed. Transabdominal technique was performed for global imaging of the pelvis including uterus, ovaries, adnexal regions, and pelvic cul-de-sac. It was necessary to proceed with endovaginal exam  following the transabdominal exam to visualize the ovaries and endometrium. Color and duplex Doppler ultrasound was utilized to evaluate blood flow to the ovaries. COMPARISON: None. FINDINGS: Uterus Measurements: 8.1 x 3.3 x 3.7 cm. No fibroids or other mass visualized. Endometrium Thickness: 9.6 mm. No focal abnormality visualized. Right ovary Measurements: 12.6 x 7.7 x 8.8 cm. Large 12.3 x 9 x 7.4 cm heterogeneously hypoechoic avascular right ovarian mass which may reflect a large endometrioma versus hemorrhagic cyst. Left ovary Measurements: 2.8 x 2.2 x 1.9 cm. Normal appearance/no adnexal mass. Pulsed Doppler evaluation of both ovaries demonstrates normal low-resistance arterial and venous waveforms. Other findings Moderate amount of pelvic free fluid. IMPRESSION: 1. Large 12.3 x 9 x 7.4 cm heterogeneously hypoechoic avascular right ovarian mass which may reflect a large endometrioma versus hemorrhagic cyst. Follow-up pelvic ultrasound in 6-8 weeks is recommended. 2. No ovarian torsion. Electronically Signed By: Elige Ko On: 04/12/2016 12:01   [place] Art/ven Flow Abd Pelv Doppler  04/12/2016 CLINICAL DATA: Myalgias with onset 6 days ago. EXAM: TRANSABDOMINAL AND TRANSVAGINAL ULTRASOUND OF PELVIS DOPPLER ULTRASOUND OF OVARIES TECHNIQUE: Both transabdominal and transvaginal ultrasound examinations of the pelvis were performed. Transabdominal technique was performed for global imaging of the pelvis including uterus, ovaries, adnexal regions, and pelvic cul-de-sac. It was necessary to proceed with endovaginal exam following the transabdominal exam to visualize the ovaries and endometrium. Color and duplex Doppler ultrasound was utilized to evaluate blood flow to the  ovaries. COMPARISON: None. FINDINGS: Uterus Measurements: 8.1 x 3.3 x 3.7 cm. No fibroids or other mass visualized. Endometrium Thickness: 9.6 mm. No focal abnormality visualized. Right ovary Measurements: 12.6 x 7.7 x 8.8 cm. Large 12.3 x 9  x 7.4 cm heterogeneously hypoechoic avascular right ovarian mass which may reflect a large endometrioma versus hemorrhagic cyst. Left ovary Measurements: 2.8 x 2.2 x 1.9 cm. Normal appearance/no adnexal mass. Pulsed Doppler evaluation of both ovaries demonstrates normal low-resistance arterial and venous waveforms. Other findings Moderate amount of pelvic free fluid. IMPRESSION: 1. Large 12.3 x 9 x 7.4 cm heterogeneously hypoechoic avascular right ovarian mass which may reflect a large endometrioma versus hemorrhagic cyst. Follow-up pelvic ultrasound in 6-8 weeks is recommended. 2. No ovarian torsion. Electronically Signed By: Elige Ko On: 04/12/2016 12:01     Assessment & Plan   Tatisha WINRY EGNEW is a 20 y.o. G0P0000 female with acute onset abdominal pain with a 12cm right ovarian cyst either a hemorrhagic cyst versus endometrioma. Discussed treatment options, including; clinical observation (do nothing at this time), treatment with pain medications and outpatient follow up, and surgery for definitive management. I can not definitively rule out ovarian torsion. After discussion a mutual decision is made to proceed with an operative laparoscopy, right ovarian cystectomy, possible right oophorectomy.  I have reviewed the risks, benefit, and alternatives of this surgery. She agrees to proceed. Will post for right now.   Conard Novak, MD 04/12/2016 3:59 PM

## 2016-04-15 ENCOUNTER — Encounter: Payer: Self-pay | Admitting: Obstetrics and Gynecology

## 2016-04-16 LAB — SURGICAL PATHOLOGY

## 2016-04-17 ENCOUNTER — Other Ambulatory Visit: Payer: Self-pay

## 2016-05-16 ENCOUNTER — Other Ambulatory Visit: Payer: Self-pay | Admitting: Family Medicine

## 2016-05-16 ENCOUNTER — Other Ambulatory Visit (HOSPITAL_COMMUNITY)
Admission: RE | Admit: 2016-05-16 | Discharge: 2016-05-16 | Disposition: A | Discharge status: Home / Self Care | Payer: BLUE CROSS/BLUE SHIELD | Source: Ambulatory Visit | Attending: Family Medicine | Admitting: Family Medicine

## 2016-05-16 DIAGNOSIS — F419 Anxiety disorder, unspecified: Secondary | ICD-10-CM | POA: Diagnosis not present

## 2016-05-16 DIAGNOSIS — Z113 Encounter for screening for infections with a predominantly sexual mode of transmission: Secondary | ICD-10-CM | POA: Diagnosis not present

## 2016-05-16 DIAGNOSIS — Z309 Encounter for contraceptive management, unspecified: Secondary | ICD-10-CM | POA: Diagnosis not present

## 2016-05-16 DIAGNOSIS — Z01419 Encounter for gynecological examination (general) (routine) without abnormal findings: Secondary | ICD-10-CM | POA: Diagnosis not present

## 2016-05-16 DIAGNOSIS — Z124 Encounter for screening for malignant neoplasm of cervix: Secondary | ICD-10-CM | POA: Diagnosis not present

## 2016-05-21 LAB — CYTOLOGY - PAP

## 2016-07-29 DIAGNOSIS — B354 Tinea corporis: Secondary | ICD-10-CM | POA: Diagnosis not present

## 2016-11-19 DIAGNOSIS — F419 Anxiety disorder, unspecified: Secondary | ICD-10-CM | POA: Diagnosis not present

## 2016-12-18 DIAGNOSIS — F419 Anxiety disorder, unspecified: Secondary | ICD-10-CM | POA: Diagnosis not present

## 2017-01-14 DIAGNOSIS — L02419 Cutaneous abscess of limb, unspecified: Secondary | ICD-10-CM | POA: Diagnosis not present

## 2017-02-11 DIAGNOSIS — N390 Urinary tract infection, site not specified: Secondary | ICD-10-CM | POA: Diagnosis not present

## 2017-03-11 DIAGNOSIS — R21 Rash and other nonspecific skin eruption: Secondary | ICD-10-CM | POA: Diagnosis not present

## 2017-06-25 DIAGNOSIS — Z309 Encounter for contraceptive management, unspecified: Secondary | ICD-10-CM | POA: Diagnosis not present

## 2017-06-25 DIAGNOSIS — F419 Anxiety disorder, unspecified: Secondary | ICD-10-CM | POA: Diagnosis not present

## 2017-09-02 DIAGNOSIS — N926 Irregular menstruation, unspecified: Secondary | ICD-10-CM | POA: Diagnosis not present

## 2017-09-02 DIAGNOSIS — Z309 Encounter for contraceptive management, unspecified: Secondary | ICD-10-CM | POA: Diagnosis not present

## 2017-09-28 ENCOUNTER — Emergency Department
Admission: EM | Admit: 2017-09-28 | Discharge: 2017-09-28 | Disposition: A | Discharge status: Home / Self Care | Payer: BLUE CROSS/BLUE SHIELD | Attending: Student in an Organized Health Care Education/Training Program | Admitting: Student in an Organized Health Care Education/Training Program

## 2017-09-28 ENCOUNTER — Encounter: Payer: Self-pay | Admitting: Emergency Medicine

## 2017-09-28 ENCOUNTER — Emergency Department: Payer: BLUE CROSS/BLUE SHIELD

## 2017-09-28 DIAGNOSIS — S46902A Unspecified injury of unspecified muscle, fascia and tendon at shoulder and upper arm level, left arm, initial encounter: Secondary | ICD-10-CM | POA: Diagnosis not present

## 2017-09-28 DIAGNOSIS — S4992XA Unspecified injury of left shoulder and upper arm, initial encounter: Secondary | ICD-10-CM | POA: Diagnosis not present

## 2017-09-28 DIAGNOSIS — S0083XA Contusion of other part of head, initial encounter: Secondary | ICD-10-CM | POA: Insufficient documentation

## 2017-09-28 DIAGNOSIS — Y9389 Activity, other specified: Secondary | ICD-10-CM | POA: Diagnosis not present

## 2017-09-28 DIAGNOSIS — M25512 Pain in left shoulder: Secondary | ICD-10-CM | POA: Diagnosis not present

## 2017-09-28 DIAGNOSIS — F17210 Nicotine dependence, cigarettes, uncomplicated: Secondary | ICD-10-CM | POA: Diagnosis not present

## 2017-09-28 DIAGNOSIS — Y999 Unspecified external cause status: Secondary | ICD-10-CM | POA: Insufficient documentation

## 2017-09-28 DIAGNOSIS — S0990XA Unspecified injury of head, initial encounter: Secondary | ICD-10-CM | POA: Diagnosis present

## 2017-09-28 DIAGNOSIS — Y92410 Unspecified street and highway as the place of occurrence of the external cause: Secondary | ICD-10-CM | POA: Diagnosis not present

## 2017-09-28 DIAGNOSIS — Z79899 Other long term (current) drug therapy: Secondary | ICD-10-CM | POA: Insufficient documentation

## 2017-09-28 DIAGNOSIS — S46912A Strain of unspecified muscle, fascia and tendon at shoulder and upper arm level, left arm, initial encounter: Secondary | ICD-10-CM | POA: Insufficient documentation

## 2017-09-28 MED ORDER — TRAMADOL HCL 50 MG PO TABS: 50.0000 mg | ORAL_TABLET | Freq: Four times a day (QID) | ORAL | 0 refills | Status: AC | PRN
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
## 2017-10-15 IMAGING — US US TRANSVAGINAL NON-OB
1 series · 13 of 25 positions shown · non-contrast
Comparison: None.

CLINICAL DATA: Myalgias with onset 6 days ago.



[Series 1: us transvaginal non-ob · 0.24mm/px · 13 of 103 slices shown]
[im 1/103]
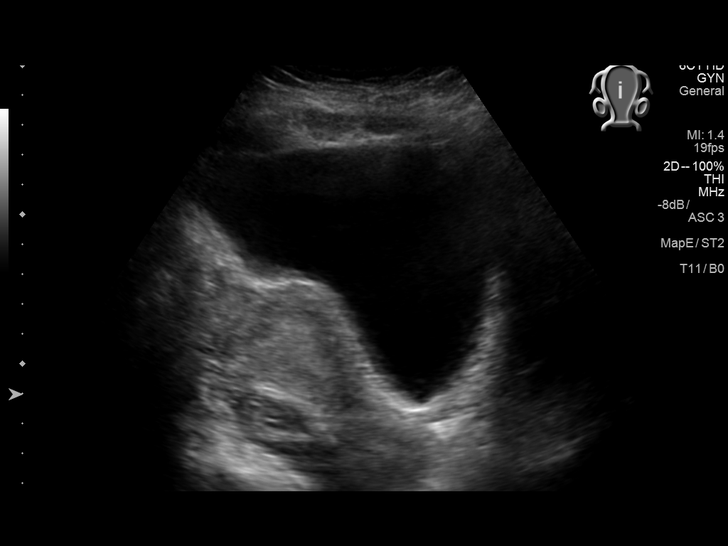
[im 9/103]
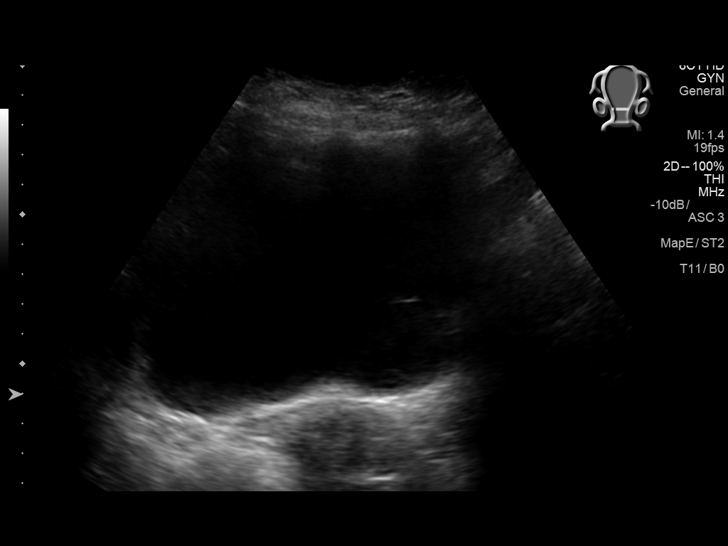
[im 18/103]
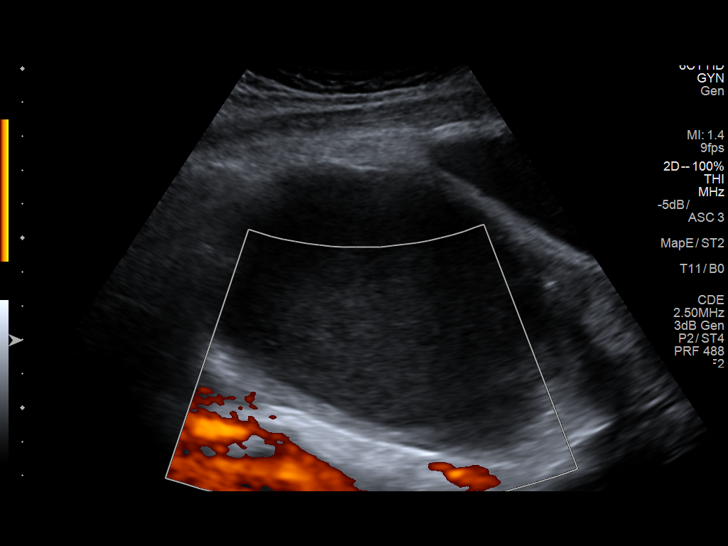
[im 26/103]
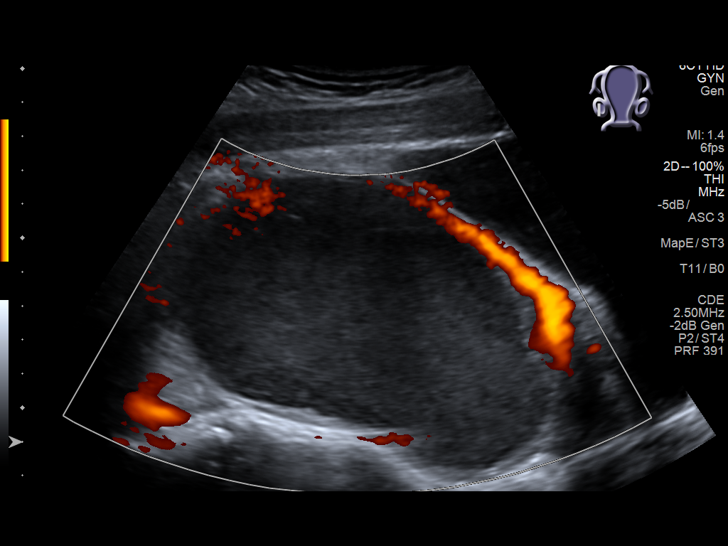
[im 35/103]
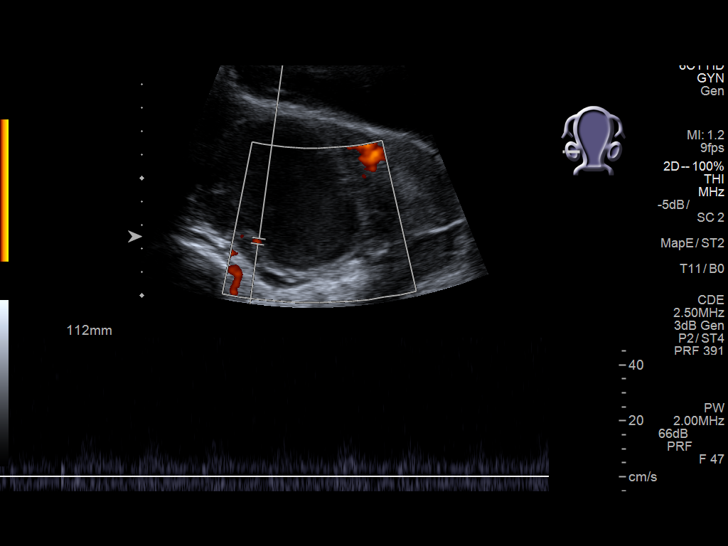
[im 43/103]
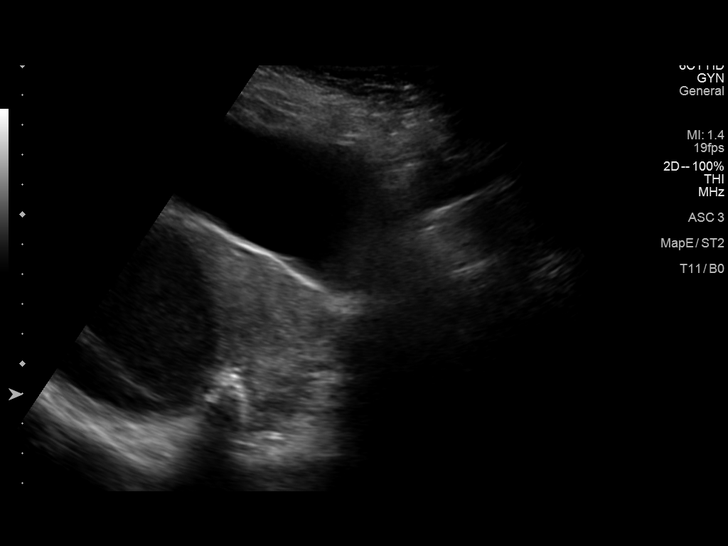
[im 52/103]
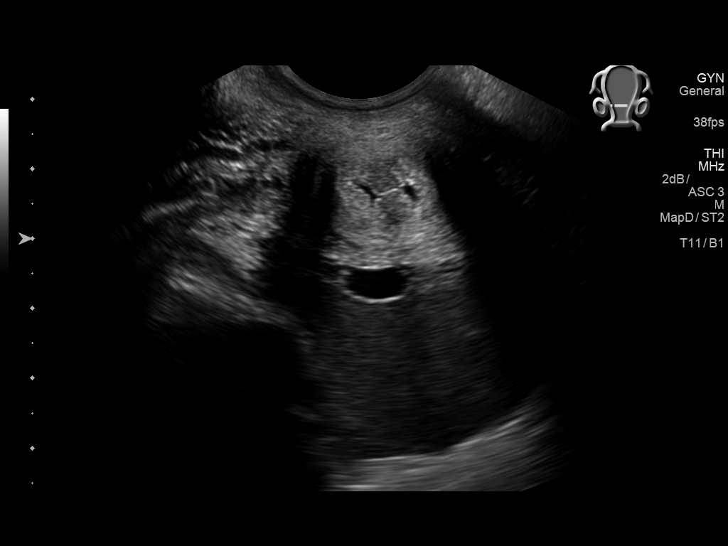
[im 60/103]
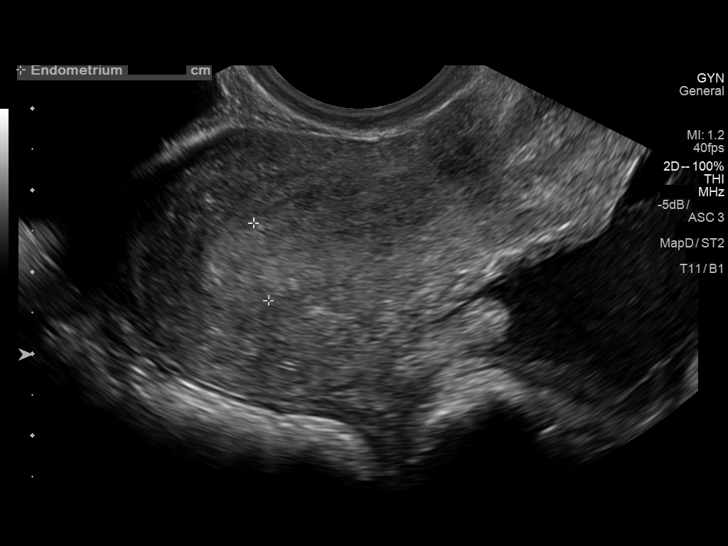
[im 69/103]
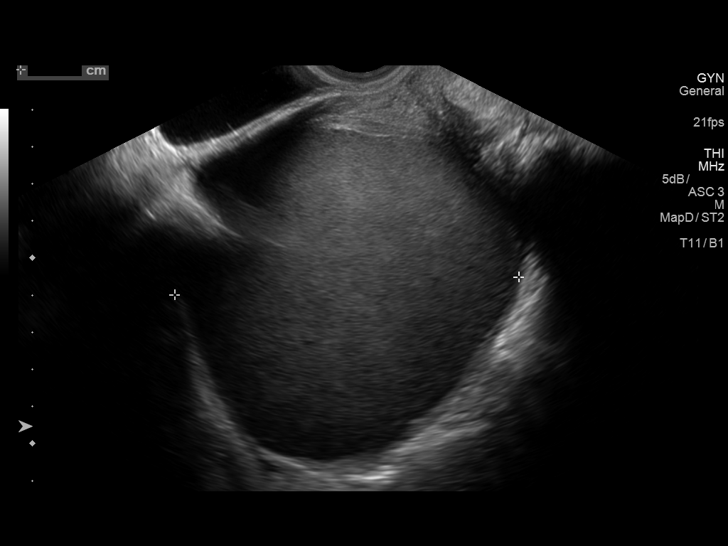
[im 77/103]
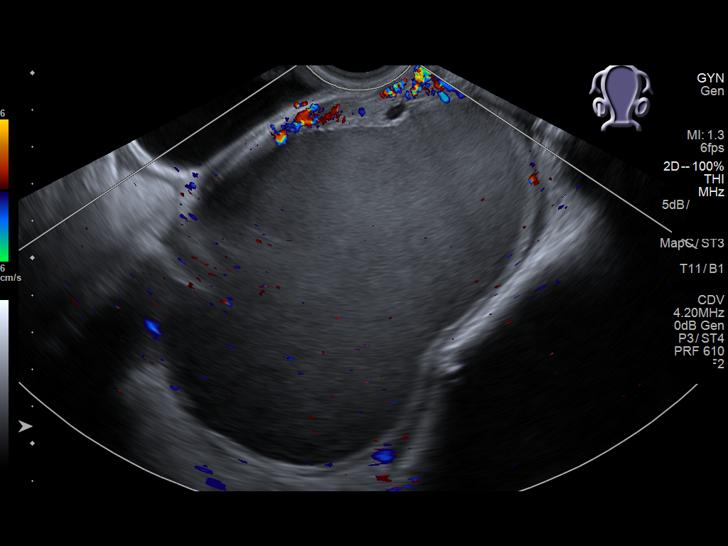
[im 86/103]
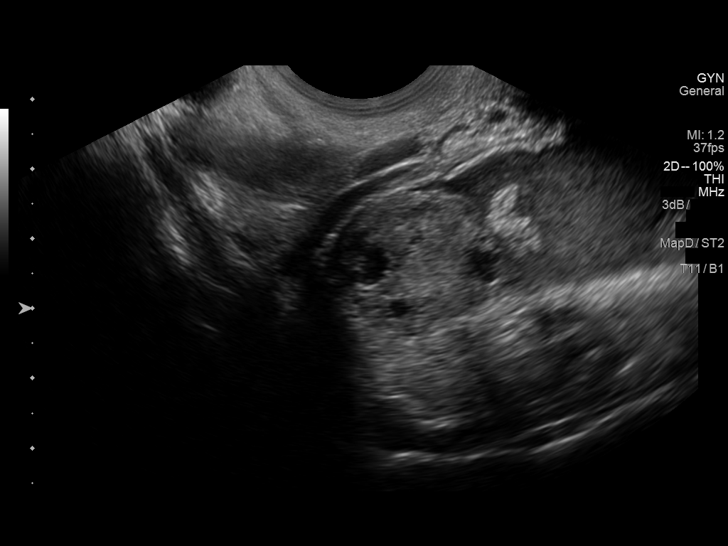
[im 94/103]
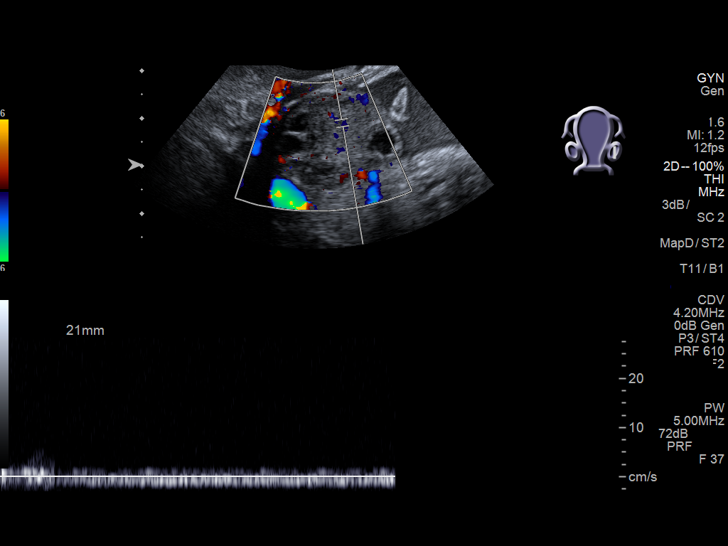
[im 103/103]
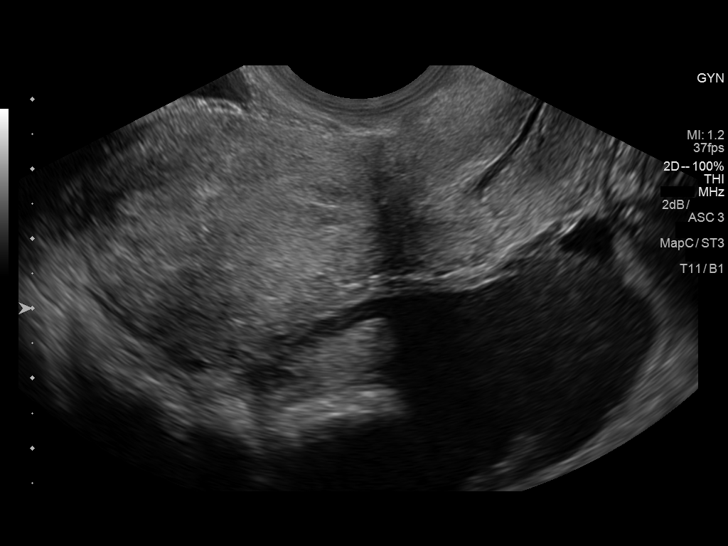

[13 of 25 positions shown; findings below may reference images not displayed]

FINDINGS: Uterus

Measurements: 8.1 x 3.3 x 3.7 cm. No fibroids or other mass
visualized.

Endometrium

Thickness: 9.6 mm.  No focal abnormality visualized.

Right ovary

Measurements: 12.6 x 7.7 x 8.8 cm. Large 12.3 x 9 x 7.4 cm
heterogeneously hypoechoic avascular right ovarian mass which may
reflect a large endometrioma versus hemorrhagic cyst.

Left ovary

Measurements: 2.8 x 2.2 x 1.9 cm. Normal appearance/no adnexal mass.

Pulsed Doppler evaluation of both ovaries demonstrates normal
low-resistance arterial and venous waveforms.

Other findings

Moderate amount of pelvic free fluid.
IMPRESSION: 1. Large 12.3 x 9 x 7.4 cm heterogeneously hypoechoic avascular
right ovarian mass which may reflect a large endometrioma versus
hemorrhagic cyst. Follow-up pelvic ultrasound in 6-8 weeks is
recommended.
2. No ovarian torsion.

## 2018-06-03 DIAGNOSIS — F329 Major depressive disorder, single episode, unspecified: Secondary | ICD-10-CM | POA: Diagnosis not present

## 2018-07-06 DIAGNOSIS — F329 Major depressive disorder, single episode, unspecified: Secondary | ICD-10-CM | POA: Diagnosis not present

## 2018-07-06 DIAGNOSIS — F419 Anxiety disorder, unspecified: Secondary | ICD-10-CM | POA: Diagnosis not present
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
## 2019-04-09 DIAGNOSIS — Z20828 Contact with and (suspected) exposure to other viral communicable diseases: Secondary | ICD-10-CM | POA: Diagnosis not present

## 2019-09-24 DIAGNOSIS — Z20828 Contact with and (suspected) exposure to other viral communicable diseases: Secondary | ICD-10-CM | POA: Diagnosis not present

## 2019-09-24 DIAGNOSIS — N760 Acute vaginitis: Secondary | ICD-10-CM | POA: Diagnosis not present

## 2019-10-20 DIAGNOSIS — N760 Acute vaginitis: Secondary | ICD-10-CM | POA: Diagnosis not present

## 2019-12-27 ENCOUNTER — Other Ambulatory Visit (HOSPITAL_COMMUNITY)
Admission: RE | Admit: 2019-12-27 | Discharge: 2019-12-27 | Disposition: A | Discharge status: Home / Self Care | Payer: BC Managed Care – PPO | Source: Ambulatory Visit | Attending: Family Medicine | Admitting: Family Medicine

## 2019-12-27 DIAGNOSIS — F419 Anxiety disorder, unspecified: Secondary | ICD-10-CM | POA: Diagnosis not present

## 2019-12-27 DIAGNOSIS — F329 Major depressive disorder, single episode, unspecified: Secondary | ICD-10-CM | POA: Diagnosis not present

## 2019-12-27 DIAGNOSIS — Z124 Encounter for screening for malignant neoplasm of cervix: Secondary | ICD-10-CM | POA: Insufficient documentation

## 2019-12-27 DIAGNOSIS — Z309 Encounter for contraceptive management, unspecified: Secondary | ICD-10-CM | POA: Diagnosis not present

## 2019-12-29 LAB — CYTOLOGY - PAP: Diagnosis: NEGATIVE

## 2020-09-29 ENCOUNTER — Ambulatory Visit
Admission: EM | Admit: 2020-09-29 | Discharge: 2020-09-29 | Disposition: A | Discharge status: Home / Self Care | Payer: 59 | Attending: Emergency Medicine | Admitting: Emergency Medicine

## 2020-09-29 ENCOUNTER — Other Ambulatory Visit: Payer: Self-pay

## 2020-09-29 ENCOUNTER — Encounter: Payer: Self-pay | Admitting: Emergency Medicine

## 2020-09-29 DIAGNOSIS — J039 Acute tonsillitis, unspecified: Secondary | ICD-10-CM | POA: Diagnosis not present

## 2020-09-29 LAB — GROUP A STREP BY PCR: Group A Strep by PCR: NOT DETECTED

## 2020-09-29 MED ORDER — AMOXICILLIN-POT CLAVULANATE 875-125 MG PO TABS: 1.0000 | ORAL_TABLET | Freq: Two times a day (BID) | ORAL | 0 refills | Status: AC

## 2020-09-29 NOTE — Discharge Instructions (Addendum)
Take the Augmentin twice daily for 7 days with food.  Use Tylenol and ibuprofen as needed for pain and fever.  Gargle with warm salt water 2-3 times a day.  If you develop any new or worsening symptoms return for reevaluation or see your primary care provider.

## 2020-09-29 NOTE — ED Triage Notes (Signed)
Patient c/o body aches, fever, headache, sore throat that started on Monday.

## 2020-09-29 NOTE — ED Provider Notes (Signed)
[facility]    CSN: [id number] Arrival date & time: 09/29/20  1135      History   Chief Complaint Chief Complaint  Patient presents with  . Generalized Body Aches  . Sore Throat  . Headache    HPI Jessica Goodman is a 24 y.o. female.   HPI   64-year-old female here for evaluation of headache, sore throat, body aches, and fever that she is had for the past 4 days.  Patient also reports that she has had bilateral ear pain that she vomited 1 time the day her symptoms started. Patient denies runny nose, cough, shortness of breath, diarrhea, changes to her sense of taste and smell, or sick contacts. Patient has not had her Covid or flu vaccines.  Past Medical History:  Diagnosis Date  . Medical history non-contributory     Patient Active Problem List   Diagnosis Date Noted  . Abdominal pain 04/12/2016  . Ovarian cyst 04/12/2016    Past Surgical History:  Procedure Laterality Date  . LAPAROSCOPIC OVARIAN CYSTECTOMY Right 04/12/2016   Procedure: LAPAROSCOPIC OVARIAN CYSTECTOMY;  Surgeon: Conard Novak, MD;  Location: [facility] ORS;  Service: Gynecology;  Laterality: Right;  . NO PAST SURGERIES      OB History    Gravida  0   Para  0   Term  0   Preterm  0   AB  0   Living  0     SAB  0   IAB  0   Ectopic  0   Multiple  0   Live Births               Home Medications    Prior to Admission medications   Medication Sig Start Date End Date Taking? Authorizing Provider  amoxicillin-clavulanate (AUGMENTIN) 875-125 MG tablet Take 1 tablet by mouth every 12 (twelve) hours. 09/29/20  Yes Becky Augusta, NP  DULoxetine (CYMBALTA) 60 MG capsule Take 1 tablet by mouth daily.    [provider]  norethindrone-ethinyl estradiol-iron (JUNEL FE 1.5/30) 1.5-30 MG-MCG tablet Take 1 tablet by mouth daily.    [provider]  sertraline (ZOLOFT) 50 MG tablet Take 50 mg by mouth daily.  09/29/20  [provider]    Family  History Family History  Problem Relation Age of Onset  . Cancer Maternal Grandfather     Social History Social History   Tobacco Use  . Smoking status: Current Every Day Smoker    Packs/day: 0.50    Types: Cigarettes  . Smokeless tobacco: Never Used  Substance Use Topics  . Alcohol use: Yes  . Drug use: No     Allergies   Patient has no known allergies.   Review of Systems Review of Systems  Constitutional: Positive for fever. Negative for activity change and appetite change.  HENT: Positive for sore throat. Negative for ear pain, rhinorrhea and sinus pain.   Respiratory: Negative for cough, shortness of breath and wheezing.   Cardiovascular: Negative for chest pain.  Gastrointestinal: Positive for vomiting. Negative for diarrhea.  Musculoskeletal: Positive for arthralgias and myalgias.  Skin: Negative for rash.  Neurological: Positive for headaches.  Hematological: Negative.   Psychiatric/Behavioral: Negative.      Physical Exam Triage Vital Signs ED Triage Vitals  Enc Vitals Group     BP 09/29/20 1415 123/76     Pulse Rate 09/29/20 1415 98     Resp 09/29/20 1415 18     Temp 09/29/20  1415 97.9 F (36.6 C)     Temp Source 09/29/20 1415 Oral     SpO2 09/29/20 1415 100 %     Weight 09/29/20 1313 160 lb (72.6 kg)     Height 09/29/20 1313 5\' 5"  (1.651 m)     Head Circumference --      Peak Flow --      Pain Score 09/29/20 1313 10     Pain Loc --      Pain Edu? --      Excl. in GC? --    No data found.  Updated Vital Signs BP 123/76 (BP Location: Right Arm)   Pulse 98   Temp 97.9 F (36.6 C) (Oral)   Resp 18   Ht 5\' 5"  (1.651 m)   Wt 160 lb (72.6 kg)   LMP 09/01/2020   SpO2 100%   BMI 26.63 kg/m   Visual Acuity Right Eye Distance:   Left Eye Distance:   Bilateral Distance:    Right Eye Near:   Left Eye Near:    Bilateral Near:     Physical Exam Vitals and nursing note reviewed.  Constitutional:      General: She is not in acute  distress.    Appearance: She is well-developed and normal weight. She is not toxic-appearing.  HENT:     Head: Normocephalic and atraumatic.     Right Ear: Tympanic membrane and ear canal normal. No middle ear effusion. Tympanic membrane is not erythematous.     Left Ear: Tympanic membrane and ear canal normal.  No middle ear effusion. Tympanic membrane is not erythematous.     Nose: Congestion and rhinorrhea present.     Comments: Nasal mucosa is mildly erythematous and edematous with scant clear nasal discharge.    Mouth/Throat:     Mouth: Mucous membranes are moist.     Pharynx: Oropharynx is clear. Posterior oropharyngeal erythema present.     Tonsils: Tonsillar exudate present. 2+ on the right. 2+ on the left.  Cardiovascular:     Rate and Rhythm: Normal rate and regular rhythm.     Heart sounds: Normal heart sounds. No murmur heard. No gallop.   Pulmonary:     Effort: Pulmonary effort is normal.     Breath sounds: Normal breath sounds. No wheezing or rhonchi.  Musculoskeletal:     Cervical back: Normal range of motion and neck supple.  Lymphadenopathy:     Cervical: Cervical adenopathy present.  Skin:    General: Skin is warm and dry.     Capillary Refill: Capillary refill takes less than 2 seconds.     Findings: No erythema or rash.  Neurological:     General: No focal deficit present.     Mental Status: She is alert.  Psychiatric:        Mood and Affect: Mood normal.        Behavior: Behavior normal.      UC Treatments / Results  Labs (all labs ordered are listed, but only abnormal results are displayed) Labs Reviewed  GROUP A STREP BY PCR    EKG   Radiology No results found.  Procedures Procedures (including critical care time)  Medications Ordered in UC Medications - No data to display  Initial Impression / Assessment and Plan / UC Course  I have reviewed the triage vital signs and the nursing notes.  Pertinent labs & imaging results that were  available during my care of the patient were reviewed by me  and considered in my medical decision making (see chart for details).   Patient is here for evaluation of sore throat, headache, body aches, and fever that have been going on for past 4 days. Patient denies any sick contacts but has not been vaccinated against Covid or flu. Physical exam reveals mildly erythematous and edematous nasal mucosa with clear nasal discharge. Posterior oropharynx has mild erythema. Tonsillar pillars are erythematous, injected, covered in white exudate. Patient does have tender, shotty, anterior cervical lymphadenopathy. Lungs are clear to auscultation in all fields. Strep PCR ais pending.  Strep PCR is negative.  Discharge home with a diagnosis of exudative tonsillitis, will give Augmentin twice daily for 7 days.  Final Clinical Impressions(s) / UC Diagnoses   Final diagnoses:  Exudative tonsillitis     Discharge Instructions     Take the Augmentin twice daily for 7 days with food.  Use Tylenol and ibuprofen as needed for pain and fever.  Gargle with warm salt water 2-3 times a day.  If you develop any new or worsening symptoms return for reevaluation or see your primary care provider.    ED Prescriptions    Medication Sig Dispense Auth. Provider   amoxicillin-clavulanate (AUGMENTIN) 875-125 MG tablet Take 1 tablet by mouth every 12 (twelve) hours. 14 tablet Becky Augusta, NP     PDMP not reviewed this encounter.   Becky Augusta, NP 09/29/20 1458
